# Patient Record
Sex: Female | Born: 1960 | Race: White | Hispanic: No | Marital: Married | Smoking: Former smoker
Health system: Southern US, Community
[De-identification: ages and names within clinical notes are randomized; demographics above are authoritative.]

## PROBLEM LIST (undated history)

## (undated) DIAGNOSIS — C801 Malignant (primary) neoplasm, unspecified: Secondary | ICD-10-CM

## (undated) DIAGNOSIS — E079 Disorder of thyroid, unspecified: Secondary | ICD-10-CM

## (undated) HISTORY — DX: Malignant (primary) neoplasm, unspecified: C80.1

## (undated) HISTORY — DX: Disorder of thyroid, unspecified: E07.9

---

## 1989-07-11 HISTORY — PX: TUBAL LIGATION: SHX77

## 1997-07-11 HISTORY — PX: MELANOMA EXCISION: SHX5266

## 2002-06-19 ENCOUNTER — Other Ambulatory Visit: Admission: RE | Admit: 2002-06-19 | Discharge: 2002-06-19 | Payer: Self-pay | Admitting: Obstetrics and Gynecology

## 2008-04-11 ENCOUNTER — Other Ambulatory Visit: Admission: RE | Admit: 2008-04-11 | Discharge: 2008-04-11 | Payer: Self-pay | Admitting: Obstetrics and Gynecology

## 2008-04-14 ENCOUNTER — Encounter: Admission: RE | Admit: 2008-04-14 | Discharge: 2008-04-14 | Payer: Self-pay | Admitting: Obstetrics and Gynecology

## 2008-07-24 ENCOUNTER — Encounter: Admission: RE | Admit: 2008-07-24 | Discharge: 2008-07-24 | Payer: Self-pay | Admitting: Internal Medicine

## 2008-08-06 ENCOUNTER — Encounter (INDEPENDENT_AMBULATORY_CARE_PROVIDER_SITE_OTHER): Payer: Self-pay | Admitting: Interventional Radiology

## 2008-08-06 ENCOUNTER — Encounter: Admission: RE | Admit: 2008-08-06 | Discharge: 2008-08-06 | Payer: Self-pay | Admitting: Internal Medicine

## 2008-08-06 ENCOUNTER — Other Ambulatory Visit: Admission: RE | Admit: 2008-08-06 | Discharge: 2008-08-06 | Payer: Self-pay | Admitting: Interventional Radiology

## 2009-04-13 ENCOUNTER — Other Ambulatory Visit: Admission: RE | Admit: 2009-04-13 | Discharge: 2009-04-13 | Payer: Self-pay | Admitting: Obstetrics and Gynecology

## 2009-04-16 ENCOUNTER — Encounter: Admission: RE | Admit: 2009-04-16 | Discharge: 2009-04-16 | Payer: Self-pay | Admitting: Obstetrics and Gynecology

## 2009-08-18 ENCOUNTER — Encounter: Admission: RE | Admit: 2009-08-18 | Discharge: 2009-08-18 | Payer: Self-pay | Admitting: Internal Medicine

## 2010-04-19 ENCOUNTER — Encounter: Admission: RE | Admit: 2010-04-19 | Discharge: 2010-04-19 | Payer: Self-pay | Admitting: Obstetrics and Gynecology

## 2010-05-06 ENCOUNTER — Other Ambulatory Visit: Admission: RE | Admit: 2010-05-06 | Discharge: 2010-05-06 | Payer: Self-pay | Admitting: Obstetrics and Gynecology

## 2011-03-15 ENCOUNTER — Other Ambulatory Visit: Payer: Self-pay | Admitting: Obstetrics and Gynecology

## 2011-03-15 DIAGNOSIS — Z1231 Encounter for screening mammogram for malignant neoplasm of breast: Secondary | ICD-10-CM

## 2011-04-25 ENCOUNTER — Ambulatory Visit
Admission: RE | Admit: 2011-04-25 | Discharge: 2011-04-25 | Disposition: A | Discharge status: Home / Self Care | Payer: 59 | Source: Ambulatory Visit | Attending: Obstetrics and Gynecology | Admitting: Obstetrics and Gynecology

## 2011-04-25 DIAGNOSIS — Z1231 Encounter for screening mammogram for malignant neoplasm of breast: Secondary | ICD-10-CM

## 2011-05-09 ENCOUNTER — Other Ambulatory Visit: Payer: Self-pay | Admitting: Obstetrics and Gynecology

## 2011-05-09 ENCOUNTER — Other Ambulatory Visit (HOSPITAL_COMMUNITY)
Admission: RE | Admit: 2011-05-09 | Discharge: 2011-05-09 | Disposition: A | Discharge status: Home / Self Care | Payer: 59 | Source: Ambulatory Visit | Attending: Obstetrics and Gynecology | Admitting: Obstetrics and Gynecology

## 2011-05-09 DIAGNOSIS — Z01419 Encounter for gynecological examination (general) (routine) without abnormal findings: Secondary | ICD-10-CM | POA: Insufficient documentation

## 2011-05-10 ENCOUNTER — Other Ambulatory Visit: Payer: Self-pay | Admitting: Internal Medicine

## 2011-05-10 DIAGNOSIS — E042 Nontoxic multinodular goiter: Secondary | ICD-10-CM

## 2011-05-18 ENCOUNTER — Other Ambulatory Visit: Payer: 59

## 2012-01-13 ENCOUNTER — Ambulatory Visit
Admission: RE | Admit: 2012-01-13 | Discharge: 2012-01-13 | Disposition: A | Discharge status: Home / Self Care | Payer: 59 | Source: Ambulatory Visit | Attending: Internal Medicine | Admitting: Internal Medicine

## 2012-01-13 DIAGNOSIS — E042 Nontoxic multinodular goiter: Secondary | ICD-10-CM

## 2012-04-03 ENCOUNTER — Other Ambulatory Visit: Payer: Self-pay | Admitting: Obstetrics and Gynecology

## 2012-04-03 DIAGNOSIS — Z1231 Encounter for screening mammogram for malignant neoplasm of breast: Secondary | ICD-10-CM

## 2012-04-25 ENCOUNTER — Ambulatory Visit
Admission: RE | Admit: 2012-04-25 | Discharge: 2012-04-25 | Disposition: A | Discharge status: Home / Self Care | Payer: 59 | Source: Ambulatory Visit | Attending: Obstetrics and Gynecology | Admitting: Obstetrics and Gynecology

## 2012-04-25 DIAGNOSIS — Z1231 Encounter for screening mammogram for malignant neoplasm of breast: Secondary | ICD-10-CM

## 2012-05-03 ENCOUNTER — Other Ambulatory Visit (HOSPITAL_COMMUNITY)
Admission: RE | Admit: 2012-05-03 | Discharge: 2012-05-03 | Disposition: A | Discharge status: Home / Self Care | Payer: 59 | Source: Ambulatory Visit | Attending: Obstetrics and Gynecology | Admitting: Obstetrics and Gynecology

## 2012-05-08 ENCOUNTER — Other Ambulatory Visit: Payer: Self-pay | Admitting: Obstetrics and Gynecology

## 2012-05-08 DIAGNOSIS — Z01419 Encounter for gynecological examination (general) (routine) without abnormal findings: Secondary | ICD-10-CM | POA: Insufficient documentation

## 2012-08-13 ENCOUNTER — Ambulatory Visit (INDEPENDENT_AMBULATORY_CARE_PROVIDER_SITE_OTHER): Payer: 59 | Admitting: Surgery

## 2012-08-13 ENCOUNTER — Encounter (INDEPENDENT_AMBULATORY_CARE_PROVIDER_SITE_OTHER): Payer: Self-pay | Admitting: Surgery

## 2012-08-13 VITALS — BP 150/88 | HR 80 | Temp 98.9°F | Resp 16 | Ht 64.5 in | Wt 158.4 lb

## 2012-08-13 DIAGNOSIS — K644 Residual hemorrhoidal skin tags: Secondary | ICD-10-CM

## 2012-08-13 DIAGNOSIS — Z800 Family history of malignant neoplasm of digestive organs: Secondary | ICD-10-CM

## 2012-08-13 DIAGNOSIS — E079 Disorder of thyroid, unspecified: Secondary | ICD-10-CM

## 2012-08-13 DIAGNOSIS — K620 Anal polyp: Secondary | ICD-10-CM

## 2012-08-13 HISTORY — DX: Disorder of thyroid, unspecified: E07.9

## 2012-08-13 NOTE — Progress Notes (Signed)
Subjective:     Patient ID: Alexandria Lambert" [name], female   DOB: [birth year], 52 y.o.   MRN: [id number]  HPI  Alexandria Lambert  1961-03-12 [id number]  Patient Care Team: Alexandria Lambert as PCP - General (Family Medicine) Alexandria Modena, MD as Consulting Physician (Gastroenterology)  This patient is a 52 y.o.female who presents today for surgical evaluation at the request of Dr. Hinda Lambert GI.   Reason for evaluation: Anal mass.  Question condyloma.  Pleasant active female.  Had severe hemorrhoid problems when she was pregnant years ago.  Not so much now.  Sister diagnosed with breast cancer age 28.  She underwent screening colonoscopy.  No abnormal masses were found until at the anal canal.Polyp seen.  Concern for condyloma.  Sent to me for evaluation from Dr. Dulce Lambert.  Patient denies any history of anal/genital warts.  No STDs.  No anal sex.  Has not had unprotected vaginal sex in a long time.  No fevers or chills.  No personal nor family history of GI/colon cancer, inflammatory bowel disease, irritable bowel syndrome, allergy such as Celiac Sprue, dietary/dairy problems, colitis, ulcers nor gastritis.  No recent sick contacts/gastroenteritis.  No travel outside the country.  No changes in diet.  She exercises regularly.    Patient Active Problem List  Diagnosis  . Thyroid disorder  . Anal polyp, right anterior pedunculated  . External hemorrhoids with itching  . Family history of rectal cancer    Past Medical History  Diagnosis Date  . Cancer     Skin  . Thyroid disease     Past Surgical History  Procedure Date  . Tubal ligation 1991  . Melanoma excision 1999    History   Social History  . Marital Status: Married    Spouse Name: N/A    Number of Children: N/A  . Years of Education: N/A   Occupational History  . Not on file.   Social History Main Topics  . Smoking status: Former Smoker    Types: Cigarettes    Quit date: 07/12/2007  . Smokeless tobacco: Never Used   . Alcohol Use: Yes     Comment: 2/day  . Drug Use: No  . Sexually Active:    Other Topics Concern  . Not on file   Social History Narrative  . No narrative on file    Family History  Problem Relation Age of Onset  . Cancer Mother     Cervical  . Cancer Father     Lung  . Cancer Sister     Rectal    Current Outpatient Prescriptions  Medication Sig Dispense Refill  . levothyroxine (SYNTHROID, LEVOTHROID) 100 MCG tablet Take 100 mcg by mouth daily.      . Multiple Vitamins-Minerals (ALIVE ONCE DAILY WOMENS 50+ PO) Take by mouth daily.      . Olopatadine HCl (PATANASE) 0.6 % SOLN Place into the nose as needed.      . vitamin C (ASCORBIC ACID) 500 MG tablet Take 500 mg by mouth as needed.         No Known Allergies  BP 150/88  Pulse 80  Temp 98.9 F (37.2 C) (Temporal)  Resp 16  Ht 5' 4.5" (1.638 m)  Wt 158 lb 6.4 oz (71.85 kg)  BMI 26.77 kg/m2  No results found.   Review of Systems  Constitutional: Negative for fever, chills, diaphoresis, appetite change and fatigue.  HENT: Negative for ear pain, sore throat, trouble swallowing, neck pain and ear  discharge.   Eyes: Negative for photophobia, discharge and visual disturbance.  Respiratory: Negative for cough, choking, chest tightness and shortness of breath.   Cardiovascular: Negative for chest pain and palpitations.  Gastrointestinal: Negative for nausea, vomiting, abdominal pain, diarrhea, constipation, anal bleeding and rectal pain.  Genitourinary: Negative for dysuria, frequency and difficulty urinating.  Musculoskeletal: Negative for myalgias and gait problem.  Skin: Negative for color change, pallor and rash.  Neurological: Negative for dizziness, speech difficulty, weakness and numbness.  Hematological: Negative for adenopathy.  Psychiatric/Behavioral: Negative for confusion and agitation. The patient is not nervous/anxious.        Objective:   Physical Exam  Constitutional: She is oriented to person,  place, and time. She appears well-developed and well-nourished. No distress.  HENT:  Head: Normocephalic.  Mouth/Throat: Oropharynx is clear and moist. No oropharyngeal exudate.  Eyes: Conjunctivae normal and EOM are normal. Pupils are equal, round, and reactive to light. No scleral icterus.  Neck: Normal range of motion. Neck supple. No tracheal deviation present.  Cardiovascular: Normal rate, regular rhythm and intact distal pulses.   Pulmonary/Chest: Effort normal and breath sounds normal. No respiratory distress. She exhibits no tenderness.  Abdominal: Soft. She exhibits no distension and no mass. There is no tenderness. Hernia confirmed negative in the right inguinal area and confirmed negative in the left inguinal area.  Genitourinary: No vaginal discharge found.       Exam done with assistance of female Medical Assistant in the room.  Perianal skin clean with good hygiene.  No pruritis.  No pilonidal disease.  No fissure.  No abscess/fistula.  Tolerates digital and anoscopic rectal exam.  Normal sphincter tone.   2 x 2 centimeter pedunculated right anterior anal canal mass. Mobile but firm/fibrotic.  No other rectal masses.   Several Pfefferkorn but sensitive external skin tags / hemorrhoids.    Musculoskeletal: Normal range of motion. She exhibits no tenderness.  Lymphadenopathy:    She has no cervical adenopathy.       Right: No inguinal adenopathy present.       Left: No inguinal adenopathy present.  Neurological: She is alert and oriented to person, place, and time. No cranial nerve deficit. She exhibits normal muscle tone. Coordination normal.  Skin: Skin is warm and dry. No rash noted. She is not diaphoretic. No erythema.  Psychiatric: She has a normal mood and affect. Her behavior is normal. Judgment and thought content normal.       Assessment:     Pedunculated anal canal mass.  Probable atypical hemorrhoid vs. Polyp.  Sensitive external hemorrhoids.    Plan:     Not  exactly certain what this anal canal mass is, but I suspect it is an atypical chronic hemorrhoid.  Condyloma vs. Adenomatous polyp is another possibility.  Given its mobility, I doubt cancer.  However family history of rectal cancer is a concern.  I strongly recommend examination under anesthesia and removal.  Offer to remove some of the external hemorrhoid/skin tags across discomfort.  She was very interested in this:  The anatomy & physiology of the digestive tract was discussed.  The pathophysiology of the rectal pathology was discussed.  Natural history risks without surgery was discussed.   I feel the risks of no intervention will lead to serious problems that outweigh the operative risks; therefore, I recommended surgery.    Laparoscopic & open abdominal techniques were discussed.  I recommended we start with a partial proctectomy by transanal examination for excisional biopsy to  remove the pathology and hopefully cure and/or control the pathology.  This technique can offer less operative risk and faster post-operative recovery.  Possible need for immediate or later abdominal surgery for further treatment was discussed. Technique of removal of external hemorrhoids discussed.  Probably would leave those wounds open to avoid scarring/stricturing.  Risks such as bleeding, abscess, reoperation, ostomy, heart attack, death, and other risks were discussed.   I noted a good likelihood this will help address the problem.  Goals of post-operative recovery were discussed as well.  We will work to minimize complications.  An educational handout was given as well.  Questions were answered.  The patient expresses understanding & wishes to proceed with surgery.

## 2012-08-13 NOTE — Patient Instructions (Addendum)
See the Handout(s) we gave you.  Consider surgery.  Please call our office at [phone] if you wish to schedule surgery or if you have further questions / concerns.   HEMORRHOIDS  The rectum is the last foot of your colon, and it naturally stretches to hold stool.  Hemorrhoidal piles are natural clusters of blood vessels that help the rectum and anal canal stretch to hold stool and allow bowel movements to eliminate feces.   Hemorrhoids are abnormally swollen blood vessels in the rectum.  Too much pressure in the rectum causes hemorrhoids by forcing blood to stretch and bulge the walls of the veins, sometimes even rupturing them.  Hemorrhoids can become like varicose veins you might see on a person's legs.  Most people will develop a flare of hemorrhoids in their lifetime.  When bulging hemorrhoidal veins are irritated, they can swell, burn, itch, cause pain, and bleed.  Most flares will calm down gradually own within a few weeks.  However, once hemorrhoids are created, they are difficult to get rid of completely and tend to flare more easily than the first flare.   Fortunately, good habits and simple medical treatment usually control hemorrhoids well, and surgery is needed only in severe cases. Types of Hemorrhoids:  Internal hemorrhoids usually don't initially hurt or itch; they are deep inside the rectum and usually have no sensation. If they begin to push out (prolapse), pain and burning can occur.  However, internal hemorrhoids can bleed.  Anal bleeding should not be ignored since bleeding could come from a dangerous source like colorectal cancer, so persistent rectal bleeding should be investigated by a doctor, sometimes with a colonoscopy.  External hemorrhoids cause most of the symptoms - pain, burning, and itching. Nonirritated hemorrhoids can look like Mier skin tags coming out of the anus.   Thrombosed hemorrhoids can form when a hemorrhoid blood vessel bursts and causes the hemorrhoid to  suddenly swell.  A purple blood clot can form in it and become an excruciatingly painful lump at the anus. Because of these unpleasant symptoms, immediate incision and drainage by a surgeon at an office visit can provide much relief of the pain.    PREVENTION Avoiding the most frequent causes listed below will prevent most cases of hemorrhoids: Constipation Hard stools Diarrhea  Constant sitting  Straining with bowel movements Sitting on the toilet for a long time  Severe coughing  episodes Pregnancy / Childbirth  Heavy Lifting  Sometimes avoiding the above triggers is difficult:  How can you avoid sitting all day if you have a seated job? Also, we try to avoid coughing and diarrhea, but sometimes it's beyond your control.  Still, there are some practical hints to help: Keep the anal and genital area clean.  Moistened tissues such as flushable wet wipes are less irritating than toilet paper.  Using irrigating showers or bottle irrigation washing gently cleans this sensitive area.   Avoid dry toilet paper when cleaning after bowel movements.  Marland Kitchen Keep the anal and genital area dry.  Lightly pat the rectal area dry.  Avoid rubbing.  Talcum or baby powders can help GET YOUR STOOLS SOFT.   This is the most important way to prevent irritated hemorrhoids.  Hard stools are like sandpaper to the anorectal canal and will cause more problems.  The goal: ONE SOFT BOWEL MOVEMENT A DAY!  BMs from every other day to 3 times a day is a tolerable range Treat coughing, diarrhea and constipation early since irritated  hemorrhoids may soon follow.  If your main job activity is seated, always stand or walk during your breaks. Make it a point to stand and walk at least 5 minutes every hour and try to shift frequently in your chair to avoid direct rectal pressure.  Always exhale as you strain or lift. Don't hold your breath.  Do not delay or try to prevent a bowel movement when the urge is present. Exercise regularly  (walking or jogging 60 minutes a day) to stimulate the bowels to move. No reading or other activity while on the toilet. If bowel movements take longer than 5 minutes, you are too constipated. AVOID CONSTIPATION Drink plenty of liquids (1 1/2 to 2 quarts of water and other fluids a day unless fluid restricted for another medical condition). Liquids that contain caffeine (coffee a, tea, soft drinks) can be dehydrating and should be avoided until constipation is controlled. Consider minimizing milk, as dairy products may be constipating. Eat plenty of fiber (30g a day ideal, more if needed).  Fiber is the undigested part of plant food that passes into the colon, acting as "natures broom" to encourage bowel motility and movement.  Fiber can absorb and hold large amounts of water. This results in a larger, bulkier stool, which is soft and easier to pass.  Eating foods high in fiber - 12 servings - such as  Vegetables: Root (potatoes, carrots, turnips), Leafy green (lettuce, salad greens, celery, spinach), High residue (cabbage, broccoli, etc.) Fruit: Fresh, Dried (prunes, apricots, cherries), Stewed (applesauce)  Whole grain breads, pasta, whole wheat Bran cereals, muffins, etc. Consider adding supplemental bulking fiber which retains large volumes of water: Psyllium ground seeds --available as Metamucil, Konsyl, Effersyllium, Per Diem Fiber, or the less expensive generic forms.  Citrucel  (methylcellulose wood fiber) . FiberCon (Polycarbophil) Polyethylene Glycol - and "artificial" fiber commonly called Miralax or Glycolax.  It is helpful for people with gassy or bloated feelings with regular fiber Flax Seed - a less gassy natural fiber  Laxatives can be useful for a short period if constipation is severe Osmotics (Milk of Magnesia, Fleets Phospho-Soda, Magnesium Citrate)  Stimulants (Senokot,   Castor Oil,  Dulcolax, Ex-Lax)    Laxatives are not a good long-term solution as it can stress the bowels  and cause too much mineral loss and dehydration.   Avoid taking laxatives for more than 7 days in a row.  AVOID DIARRHEA Switch to liquids and simpler foods for a few days to avoid stressing your intestines further. Avoid dairy products (especially milk & ice cream) for a short time.  The intestines often can lose the ability to digest lactose when stressed. Avoid foods that cause gassiness or bloating.  Typical foods include beans and other legumes, cabbage, broccoli, and dairy foods.  Every person has some sensitivity to other foods, so listen to your body and avoid those foods that trigger problems for you. Adding fiber (Citrucel, Metamucil, FiberCon, Flax seed, Miralax) gradually can help thicken stools by absorbing excess fluid and retrain the intestines to act more normally.  Slowly increase the dose over a few weeks.  Too much fiber too soon can backfire and cause cramping & bloating. Probiotics (such as active yogurt, Align, etc) may help repopulate the intestines and colon with normal bacteria and calm down a sensitive digestive tract.  Most studies show it to be of mild help, though, and such products can be costly. Medicines: Bismuth subsalicylate (ex. Kayopectate, Pepto Bismol) every 30 minutes for up to  6 doses can help control diarrhea.  Avoid if pregnant. Loperamide (Immodium) can slow down diarrhea.  Start with two tablets (4mg  total) first and then try one tablet every 6 hours.  Avoid if you are having fevers or severe pain.  If you are not better or start feeling worse, stop all medicines and call your doctor for advice Call your doctor if you are getting worse or not better.  Sometimes further testing (cultures, endoscopy, X-ray studies, bloodwork, etc) may be needed to help diagnose and treat the cause of the diarrhea. TREATMENT OF HEMORRHOID FLARE If these preventive measures fail, you must take action right away! Hemorrhoids are one condition that can be mild in the morning and  become intolerable by nightfall. Most hemorrhoidal flares take several weeks to calm down.  These suggestions can help: Warm soaks.  This helps more than any topical medication.  Use up to 8 times a day.  Usually sitz baths or sitting in a warm bathtub helps.  Sitting on moist warm towels are helpful.  Switching to ice packs/cool compresses can be helpful Normalize your bowels.  Extremes of diarrhea or constipation will make hemorrhoids worse.  One soft bowel movement a day is the goal.  Fiber can help get your bowels regular Wet wipes instead of toilet paper Pain control with a NSAID such as ibuprofen (Advil) or naproxen (Aleve) or acetaminophen (Tylenol) around the clock.  Narcotics are constipating and should be minimized if possible Topical creams contain steroids (bydrocortisone) or local anesthetic (xylocaine) can help make pain and itching more tolerable.   EVALUATION If hemorrhoids are still causing problems, you could benefit by an evaluation by a surgeon.  The surgeon will obtain a history and examine you.  If hemorrhoids are diagnosed, some therapies can be offered in the office, usually with an anoscope into the less sensitive area of the rectum: -injection of hemorrhoids (sclerotherapy) can scar the blood vessels of the swollen/enlarged hemorrhoids to help shrink them down to a more normal size -rubber banding of the enlarged hemorrhoids to help shrink them down to a more normal size -drainage of the blood clot causing a thrombosed hemorrhoid,  to relieve the severe pain   While 90% of the time such problems from hemorrhoids can be managed without preceding to surgery, sometimes the hemorrhoids require a operation to control the problem (uncontrolled bleeding, prolapse, pain, etc.).   This involves being placed under general anesthesia where the surgeon can confirm the diagnosis and remove, suture, or staple the hemorrhoid(s).  Your surgeon can help you treat the problem appropriately.       Colon Polyps Polyps are lumps of extra tissue growing inside the body. Polyps can grow in the large intestine (colon). Most colon polyps are noncancerous (benign). However, some colon polyps can become cancerous over time. Polyps that are larger than a pea may be harmful. To be safe, caregivers remove and test all polyps. CAUSES  Polyps form when mutations in the genes cause your cells to grow and divide even though no more tissue is needed. RISK FACTORS There are a number of risk factors that can increase your chances of getting colon polyps. They include:  Being older than 50 years.  Family history of colon polyps or colon cancer.  Long-term colon diseases, such as colitis or Crohn disease.  Being overweight.  Smoking.  Being inactive.  Drinking too much alcohol. SYMPTOMS  Most [place] polyps do not cause symptoms. If symptoms are present, they may include:  Blood in the stool. The stool may look dark red or black.  Constipation or diarrhea that lasts longer than 1 week. DIAGNOSIS People often do not know they have polyps until their caregiver finds them during a regular checkup. Your caregiver can use 4 tests to check for polyps:  Digital rectal exam. The caregiver wears gloves and feels inside the rectum. This test would find polyps only in the rectum.  Barium enema. The caregiver puts a liquid called barium into your rectum before taking X-rays of your colon. Barium makes your colon look white. Polyps are dark, so they are easy to see in the X-ray pictures.  Sigmoidoscopy. A thin, flexible tube (sigmoidoscope) is placed into your rectum. The sigmoidoscope has a light and tiny camera in it. The caregiver uses the sigmoidoscope to look at the last third of your colon.  Colonoscopy. This test is like sigmoidoscopy, but the caregiver looks at the entire colon. This is the most common method for finding and removing polyps. TREATMENT  Any polyps will be removed during a  sigmoidoscopy or colonoscopy. The polyps are then tested for cancer. PREVENTION  To help lower your risk of getting more colon polyps:  Eat plenty of fruits and vegetables. Avoid eating fatty foods.  Do not smoke.  Avoid drinking alcohol.  Exercise every day.  Lose weight if recommended by your caregiver.  Eat plenty of calcium and folate. Foods that are rich in calcium include milk, cheese, and broccoli. Foods that are rich in folate include chickpeas, kidney beans, and spinach. HOME CARE INSTRUCTIONS Keep all follow-up appointments as directed by your caregiver. You may need periodic exams to check for polyps. SEEK MEDICAL CARE IF: You notice bleeding during a bowel movement. Document Released: 03/23/2004 Document Revised: 09/19/2011 Document Reviewed: 09/06/2011 [facility] Patient Information 2013 [place], [place].

## 2012-08-16 ENCOUNTER — Encounter (INDEPENDENT_AMBULATORY_CARE_PROVIDER_SITE_OTHER): Payer: Self-pay

## 2012-08-17 ENCOUNTER — Telehealth (INDEPENDENT_AMBULATORY_CARE_PROVIDER_SITE_OTHER): Payer: Self-pay

## 2012-08-17 NOTE — Telephone Encounter (Signed)
The pt called because surgery is Tuesday. She has to start a prep Sunday.  She is supposed to be on clears and wants to know if she can use an Herbal protein powder that she mixes with water.  It is orange.  I told her this is fine.

## 2012-08-21 ENCOUNTER — Other Ambulatory Visit (INDEPENDENT_AMBULATORY_CARE_PROVIDER_SITE_OTHER): Payer: Self-pay | Admitting: Surgery

## 2012-08-21 DIAGNOSIS — K648 Other hemorrhoids: Secondary | ICD-10-CM

## 2012-08-21 DIAGNOSIS — K644 Residual hemorrhoidal skin tags: Secondary | ICD-10-CM

## 2012-08-21 HISTORY — PX: EXCISIONAL HEMORRHOIDECTOMY: SHX1541

## 2012-08-24 ENCOUNTER — Telehealth (INDEPENDENT_AMBULATORY_CARE_PROVIDER_SITE_OTHER): Payer: Self-pay

## 2012-08-24 NOTE — Telephone Encounter (Signed)
Called pt to notify her that her path is benign per Dr Michaell Cowing.

## 2012-09-10 ENCOUNTER — Ambulatory Visit (INDEPENDENT_AMBULATORY_CARE_PROVIDER_SITE_OTHER): Payer: 59 | Admitting: Surgery

## 2012-09-10 ENCOUNTER — Encounter (INDEPENDENT_AMBULATORY_CARE_PROVIDER_SITE_OTHER): Payer: Self-pay | Admitting: Surgery

## 2012-09-10 ENCOUNTER — Encounter (INDEPENDENT_AMBULATORY_CARE_PROVIDER_SITE_OTHER): Payer: Self-pay

## 2012-09-10 VITALS — BP 110/62 | HR 83 | Temp 97.0°F | Resp 18 | Ht 64.5 in | Wt 151.2 lb

## 2012-09-10 DIAGNOSIS — K620 Anal polyp: Secondary | ICD-10-CM

## 2012-09-10 DIAGNOSIS — K621 Rectal polyp: Secondary | ICD-10-CM

## 2012-09-10 DIAGNOSIS — K644 Residual hemorrhoidal skin tags: Secondary | ICD-10-CM

## 2012-09-10 NOTE — Progress Notes (Signed)
Subjective:     Patient ID: Alexandria Lambert, female   DOB: Dec 02, 1960, 52 y.o.   MRN: [id number]  HPI  Alexandria Lambert  1960/08/29 [id number]  Patient Care Team: Cain Saupe as PCP - General (Family Medicine) Willis Modena, MD as Consulting Physician (Gastroenterology) Talmage Coin, MD as Consulting Physician (Endocrinology)  This patient is a 52 y.o.female who presents today for surgical evaluation Status post excision of external hemorrhoids and ligation of hemorrhoids 08/24/2012  The patient comes in today feeling better.  Sore the first week but started to improve.  Went from walking half-hour day.  Doing low impact aerobics.  Looking to get back to work.  Wanting to do more intense exercise.  Double up on fiber.  Benefiber two twice a day.  Having 3-4 loose bowel movements a day.  No bleeding.  On narcotics.  No fevers or chills.  Heels a string coming out of the anus.  Overall she feels like she is making progress.  Patient Active Problem List  Diagnosis  . Thyroid disorder  . Anal polyp, right anterior pedunculated  . External hemorrhoids with itching  . Family history of rectal cancer    Past Medical History  Diagnosis Date  . Cancer     Skin  . Thyroid disorder 08/13/2012    On chronic levothyroxine.  Followed by Dr. Talmage Coin in past     Past Surgical History  Procedure Laterality Date  . Tubal ligation  1991  . Melanoma excision  1999    History   Social History  . Marital Status: Married    Spouse Name: N/A    Number of Children: N/A  . Years of Education: N/A   Occupational History  . Not on file.   Social History Main Topics  . Smoking status: Former Smoker    Types: Cigarettes    Quit date: 07/12/2007  . Smokeless tobacco: Never Used  . Alcohol Use: Yes     Comment: 2/day  . Drug Use: No  . Sexually Active:    Other Topics Concern  . Not on file   Social History Narrative  . No narrative on file    Family History  Problem Relation Age  of Onset  . Cancer Mother     Cervical  . Cancer Father     Lung  . Cancer Sister     Rectal    Current Outpatient Prescriptions  Medication Sig Dispense Refill  . Biotin 1000 MCG tablet Take 1,000 mcg by mouth 3 (three) times daily.      Marland Kitchen levothyroxine (SYNTHROID, LEVOTHROID) 88 MCG tablet Take 88 mcg by mouth daily.      . Multiple Vitamins-Minerals (ALIVE ONCE DAILY WOMENS 50+ PO) Take by mouth daily.      . Nutritional Supplements (CHOICE DM FIBER-BURST PO) Take by mouth.      . Olopatadine HCl (PATANASE) 0.6 % SOLN Place into the nose as needed.      . vitamin C (ASCORBIC ACID) 500 MG tablet Take 500 mg by mouth as needed.       No current facility-administered medications for this visit.     No Known Allergies  BP 110/62  Pulse 83  Temp(Src) 97 F (36.1 C) (Temporal)  Resp 18  Ht 5' 4.5" (1.638 m)  Wt 151 lb 3.2 oz (68.584 kg)  BMI 25.56 kg/m2  No results found.   Review of Systems  Constitutional: Negative for fever, chills and diaphoresis.  HENT: Negative  for ear pain, sore throat and trouble swallowing.   Eyes: Negative for photophobia and visual disturbance.  Respiratory: Negative for cough and choking.   Cardiovascular: Negative for chest pain and palpitations.  Gastrointestinal: Positive for rectal pain. Negative for nausea, vomiting, abdominal pain, diarrhea, constipation, blood in stool and abdominal distention.  Genitourinary: Negative for dysuria, frequency and difficulty urinating.  Musculoskeletal: Negative for myalgias and gait problem.  Skin: Negative for color change, pallor and rash.  Neurological: Negative for dizziness, speech difficulty, weakness and numbness.  Hematological: Negative for adenopathy.  Psychiatric/Behavioral: Negative for confusion and agitation. The patient is not nervous/anxious.        Objective:   Physical Exam  Constitutional: She is oriented to person, place, and time. She appears well-developed and well-nourished.  No distress.  HENT:  Head: Normocephalic.  Mouth/Throat: Oropharynx is clear and moist. No oropharyngeal exudate.  Eyes: Conjunctivae and EOM are normal. Pupils are equal, round, and reactive to light. No scleral icterus.  Neck: Normal range of motion. No tracheal deviation present.  Cardiovascular: Normal rate and intact distal pulses.   Pulmonary/Chest: Effort normal. No respiratory distress. She exhibits no tenderness.  Abdominal: Soft. She exhibits no distension. There is no tenderness. Hernia confirmed negative in the right inguinal area and confirmed negative in the left inguinal area.  Incisions clean with normal healing ridges.  No hernias  Genitourinary: No vaginal discharge found.  Exam done with assistance of female Medical Assistant in the room.  Perianal skin clean with good hygiene.  No pruritis.  Few Ocain swollen right postlat skin tag hems.  No pilonidal disease.  No fissure.  No abscess/fistula.     Normal sphincter tone.  Exposed Vicryl stitch.  I trimmed off.  Granulating left posterior lateral wound.  No digital or anoscopic exam today given recent postoperative state  Musculoskeletal: Normal range of motion. She exhibits no tenderness.  Lymphadenopathy:       Right: No inguinal adenopathy present.       Left: No inguinal adenopathy present.  Neurological: She is alert and oriented to person, place, and time. No cranial nerve deficit. She exhibits normal muscle tone. Coordination normal.  Skin: Skin is warm and dry. No rash noted. She is not diaphoretic.  Psychiatric: She has a normal mood and affect. Her behavior is normal.       Assessment:     Status post excision of external hemorrhoids with pexy.  Recovering well.  Loose bowels on very high fiber.     Plan:     Increase activity as tolerated to regular activity.  Do not push through pain.    Diet as tolerated. Bowel regimen to avoid problems.  Back off on fiber to usual Benefiber twice a day.  Avoid double  doses for now.  Okay to return to work.  Start half-time for a few days and then transition back to full-time.  Desk work only.  The anatomy & physiology of the anorectal region was discussed.  The pathophysiology of hemorrhoids and differential diagnosis was discussed.  Natural history progression  was discussed.   I stressed the importance of a bowel regimen to have daily soft bowel movements to minimize progression of disease.   Goal of one BM / day ideal.  Use of wet wipes, warm baths, avoiding straining, etc were emphasized.  Educational handouts further explaining the pathology, treatment options, and bowel regimen were given as well.   The patient expressed understanding.  Return to clinic 3-4 weeks.  Instructions discussed.  Followup with primary care physician for other health issues as would normally be done.  Questions answered.  The patient expressed understanding and appreciation

## 2012-09-10 NOTE — Patient Instructions (Addendum)
ANORECTAL SURGERY: POST OP INSTRUCTIONS  1. Take your usually prescribed home medications unless otherwise directed. 2. DIET: Follow a light bland diet the first 24 hours after arrival home, such as soup, liquids, crackers, etc.  Be sure to include lots of fluids daily.  Avoid fast food or heavy meals as your are more likely to get nauseated.  Eat a low fat the next few days after surgery.   3. PAIN CONTROL: a. Pain is best controlled by a usual combination of three different methods TOGETHER: i. Ice/Heat ii. Over the counter pain medication iii. Prescription pain medication b. Most patients will experience some swelling and discomfort in the anus/rectal area. and incisions.  Ice packs or heat (30-60 minutes up to 6 times a day) will help. Use ice for the first few days to help decrease swelling and bruising, then switch to heat such as warm towels, sitz baths, warm baths, etc to help relax tight/sore spots and speed recovery.  Some people prefer to use ice alone, heat alone, alternating between ice & heat.  Experiment to what works for you.  Swelling and bruising can take several weeks to resolve.   c. It is helpful to take an over-the-counter pain medication regularly for the first few weeks.  Choose one of the following that works best for you: i. Naproxen (Aleve, etc)  Two 220mg tabs twice a day ii. Ibuprofen (Advil, etc) Three 200mg tabs four times a day (every meal & bedtime) iii. Acetaminophen (Tylenol, etc) 500-650mg four times a day (every meal & bedtime) d. A  prescription for pain medication (such as oxycodone, hydrocodone, etc) should be given to you upon discharge.  Take your pain medication as prescribed.  i. If you are having problems/concerns with the prescription medicine (does not control pain, nausea, vomiting, rash, itching, etc), please call us [phone] to see if we need to switch you to a different pain medicine that will work better for you and/or control your side effect  better. ii. If you need a refill on your pain medication, please contact your pharmacy.  They will contact our office to request authorization. Prescriptions will not be filled after 5 pm or on week-ends. 4. KEEP YOUR BOWELS REGULAR a. The goal is one bowel movement a day b. Avoid getting constipated.  Between the surgery and the pain medications, it is common to experience some constipation.  Increasing fluid intake and taking a fiber supplement (such as Metamucil, Citrucel, FiberCon, MiraLax, etc) 1-2 times a day regularly will usually help prevent this problem from occurring.  A mild laxative (prune juice, Milk of Magnesia, MiraLax, etc) should be taken according to package directions if there are no bowel movements after 48 hours. c. Watch out for diarrhea.  If you have many loose bowel movements, simplify your diet to bland foods & liquids for a few days.  Stop any stool softeners and decrease your fiber supplement.  Switching to mild anti-diarrheal medications (Kayopectate, Pepto Bismol) can help.  If this worsens or does not improve, please call us.  5. Wound Care a. Remove your bandages the day after surgery.  Unless discharge instructions indicate otherwise, leave your bandage dry and in place overnight.  Remove the bandage during your first bowel movement.   b. Allow the wound packing to fall out over the next few days.  You can trim exposed gauze / ribbon as it falls out.  You do not need to repack the wound unless instructed otherwise.  Wear an   absorbent pad or soft cotton gauze in your underwear as needed to catch any drainage and help keep the area  c. Keep the area clean and dry.  Bathe / shower every day.  Keep the area clean by showering / bathing over the incision / wound.   It is okay to soak an open wound to help wash it.  Wet wipes or showers / gentle washing after bowel movements is often less traumatic than regular toilet paper. d. Dennis Bast may have some styrofoam-like soft packing in  the rectum which will come out with the first bowel movement.  e. You will often notice bleeding with bowel movements.  This should slow down by the end of the first week of surgery f. Expect some drainage.  This should slow down, too, by the end of the first week of surgery.  Wear an absorbent pad or soft cotton gauze in your underwear until the drainage stops. 6. ACTIVITIES as tolerated:   a. You may resume regular (light) daily activities beginning the next day-such as daily self-care, walking, climbing stairs-gradually increasing activities as tolerated.  If you can walk 30 minutes without difficulty, it is safe to try more intense activity such as jogging, treadmill, bicycling, low-impact aerobics, swimming, etc. b. Save the most intensive and strenuous activity for last such as sit-ups, heavy lifting, contact sports, etc  Refrain from any heavy lifting or straining until you are off narcotics for pain control.   c. DO NOT PUSH THROUGH PAIN.  Let pain be your guide: If it hurts to do something, don't do it.  Pain is your body warning you to avoid that activity for another week until the pain goes down. d. You may drive when you are no longer taking prescription pain medication, you can comfortably sit for long periods of time, and you can safely maneuver your car and apply brakes. e. Dennis Bast may have sexual intercourse when it is comfortable.  7. FOLLOW UP in our office a. Please call [facility] at [phone] to set up an appointment to see your surgeon in the office for a follow-up appointment approximately 2 weeks after your surgery. b. Make sure that you call for this appointment the day you arrive home to insure a convenient appointment time. 10. IF YOU HAVE DISABILITY OR FAMILY LEAVE FORMS, BRING THEM TO THE OFFICE FOR PROCESSING.  DO NOT GIVE THEM TO YOUR DOCTOR.        WHEN TO CALL us [phone]: 1. Poor pain control 2. Reactions / problems with new medications (rash/itching, nausea,  etc)  3. Fever over 101.5 F (38.5 C) 4. Inability to urinate 5. Nausea and/or vomiting 6. Worsening swelling or bruising 7. Continued bleeding from incision. 8. Increased pain, redness, or drainage from the incision  The clinic staff is available to answer your questions during regular business hours (8:30am-5pm).  Please don't hesitate to call and ask to speak to one of our nurses for clinical concerns.   A surgeon from [facility] is always on call at the hospitals   If you have a medical emergency, go to the nearest emergency room or call 911.    [facility], [address], [place], [place], [place]  [address] ? MAIN: [phone] ? TOLL FREE: [phone] ? FAX [phone] [email or web address]  GETTING TO GOOD BOWEL HEALTH. Irregular bowel habits such as constipation and diarrhea can lead to many problems over time.  Having one soft bowel movement a day  is the most important way to prevent further problems.  The anorectal canal is designed to handle stretching and feces to safely manage our ability to get rid of solid waste (feces, poop, stool) out of our body.  BUT, hard constipated stools can act like ripping concrete bricks and diarrhea can be a burning fire to this very sensitive area of our body, causing inflamed hemorrhoids, anal fissures, increasing risk is perirectal abscesses, abdominal pain/bloating, an making irritable bowel worse.     The goal: ONE SOFT BOWEL MOVEMENT A DAY!  To have soft, regular bowel movements:    Drink at least 8 tall glasses of water a day.     Take plenty of fiber.  Fiber is the undigested part of plant food that passes into the colon, acting s "natures broom" to encourage bowel motility and movement.  Fiber can absorb and hold large amounts of water. This results in a larger, bulkier stool, which is soft and easier to pass. Work gradually over several weeks up to 6 servings a day of fiber (25g a day  even more if needed) in the form of: o Vegetables -- Root (potatoes, carrots, turnips), leafy green (lettuce, salad greens, celery, spinach), or cooked high residue (cabbage, broccoli, etc) o Fruit -- Fresh (unpeeled skin & pulp), Dried (prunes, apricots, cherries, etc ),  or stewed ( applesauce)  o Whole grain breads, pasta, etc (whole wheat)  o Bran cereals    Bulking Agents -- This type of water-retaining fiber generally is easily obtained each day by one of the following:  o Psyllium bran -- The psyllium plant is remarkable because its ground seeds can retain so much water. This product is available as Metamucil, Konsyl, Effersyllium, Per Diem Fiber, or the less expensive generic preparation in drug and health food stores. Although labeled a laxative, it really is not a laxative.  o Methylcellulose -- This is another fiber derived from wood which also retains water. It is available as Citrucel. o Polyethylene Glycol - and "artificial" fiber commonly called Miralax or Glycolax.  It is helpful for people with gassy or bloated feelings with regular fiber o Flax Seed - a less gassy fiber than psyllium   No reading or other relaxing activity while on the toilet. If bowel movements take longer than 5 minutes, you are too constipated   AVOID CONSTIPATION.  High fiber and water intake usually takes care of this.  Sometimes a laxative is needed to stimulate more frequent bowel movements, but    Laxatives are not a good long-term solution as it can wear the colon out. o Osmotics (Milk of Magnesia, Fleets phosphosoda, Magnesium citrate, MiraLax, GoLytely) are safer than  o Stimulants (Senokot, Castor Oil, Dulcolax, Ex Lax)    o Do not take laxatives for more than 7days in a row.    IF SEVERELY CONSTIPATED, try a Bowel Retraining Program: o Do not use laxatives.  o Eat a diet high in roughage, such as bran cereals and leafy vegetables.  o Drink six (6) ounces of prune or apricot juice each morning.   o Eat two (2) large servings of stewed fruit each day.  o Take one (1) heaping tablespoon of a psyllium-based bulking agent twice a day. Use sugar-free sweetener when possible to avoid excessive calories.  o Eat a normal breakfast.  o Set aside 15 minutes after breakfast to sit on the toilet, but do not strain to have a bowel movement.  o If you do not have  a bowel movement by the third day, use an enema and repeat the above steps.    Controlling diarrhea o Switch to liquids and simpler foods for a few days to avoid stressing your intestines further. o Avoid dairy products (especially milk & ice cream) for a short time.  The intestines often can lose the ability to digest lactose when stressed. o Avoid foods that cause gassiness or bloating.  Typical foods include beans and other legumes, cabbage, broccoli, and dairy foods.  Every person has some sensitivity to other foods, so listen to our body and avoid those foods that trigger problems for you. o Adding fiber (Citrucel, Metamucil, psyllium, Miralax) gradually can help thicken stools by absorbing excess fluid and retrain the intestines to act more normally.  Slowly increase the dose over a few weeks.  Too much fiber too soon can backfire and cause cramping & bloating. o Probiotics (such as active yogurt, Align, etc) may help repopulate the intestines and colon with normal bacteria and calm down a sensitive digestive tract.  Most studies show it to be of mild help, though, and such products can be costly. o Medicines:   Bismuth subsalicylate (ex. Kayopectate, Pepto Bismol) every 30 minutes for up to 6 doses can help control diarrhea.  Avoid if pregnant.   Loperamide (Immodium) can slow down diarrhea.  Start with two tablets (4mg  total) first and then try one tablet every 6 hours.  Avoid if you are having fevers or severe pain.  If you are not better or start feeling worse, stop all medicines and call your doctor for advice o Call your doctor if you  are getting worse or not better.  Sometimes further testing (cultures, endoscopy, X-ray studies, bloodwork, etc) may be needed to help diagnose and treat the cause of the diarrhea. o

## 2012-10-16 ENCOUNTER — Ambulatory Visit (INDEPENDENT_AMBULATORY_CARE_PROVIDER_SITE_OTHER): Payer: 59 | Admitting: Surgery

## 2012-10-16 ENCOUNTER — Encounter (INDEPENDENT_AMBULATORY_CARE_PROVIDER_SITE_OTHER): Payer: Self-pay | Admitting: Surgery

## 2012-10-16 VITALS — BP 126/70 | HR 76 | Temp 97.7°F | Resp 16 | Ht 64.5 in | Wt 154.0 lb

## 2012-10-16 DIAGNOSIS — K620 Anal polyp: Secondary | ICD-10-CM

## 2012-10-16 DIAGNOSIS — K644 Residual hemorrhoidal skin tags: Secondary | ICD-10-CM

## 2012-10-16 NOTE — Patient Instructions (Signed)
HEMORRHOIDS  The rectum is the last foot of your colon, and it naturally stretches to hold stool.  Hemorrhoidal piles are natural clusters of blood vessels that help the rectum and anal canal stretch to hold stool and allow bowel movements to eliminate feces.   Hemorrhoids are abnormally swollen blood vessels in the rectum.  Too much pressure in the rectum causes hemorrhoids by forcing blood to stretch and bulge the walls of the veins, sometimes even rupturing them.  Hemorrhoids can become like varicose veins you might see on a person's legs.  Most people will develop a flare of hemorrhoids in their lifetime.  When bulging hemorrhoidal veins are irritated, they can swell, burn, itch, cause pain, and bleed.  Most flares will calm down gradually own within a few weeks.  However, once hemorrhoids are created, they are difficult to get rid of completely and tend to flare more easily than the first flare.   Fortunately, good habits and simple medical treatment usually control hemorrhoids well, and surgery is needed only in severe cases. Types of Hemorrhoids:  Internal hemorrhoids usually don't initially hurt or itch; they are deep inside the rectum and usually have no sensation. If they begin to push out (prolapse), pain and burning can occur.  However, internal hemorrhoids can bleed.  Anal bleeding should not be ignored since bleeding could come from a dangerous source like colorectal cancer, so persistent rectal bleeding should be investigated by a doctor, sometimes with a colonoscopy.  External hemorrhoids cause most of the symptoms - pain, burning, and itching. Nonirritated hemorrhoids can look like [place] skin tags coming out of the anus.   Thrombosed hemorrhoids can form when a hemorrhoid blood vessel bursts and causes the hemorrhoid to suddenly swell.  A purple blood clot can form in it and become an excruciatingly painful lump at the anus. Because of these unpleasant symptoms, immediate incision and  drainage by a surgeon at an office visit can provide much relief of the pain.    PREVENTION Avoiding the most frequent causes listed below will prevent most cases of hemorrhoids: Constipation Hard stools Diarrhea  Constant sitting  Straining with bowel movements Sitting on the toilet for a long time  Severe coughing  episodes Pregnancy / Childbirth  Heavy Lifting  Sometimes avoiding the above triggers is difficult:  How can you avoid sitting all day if you have a seated job? Also, we try to avoid coughing and diarrhea, but sometimes it's beyond your control.  Still, there are some practical hints to help: Keep the anal and genital area clean.  Moistened tissues such as flushable wet wipes are less irritating than toilet paper.  Using irrigating showers or bottle irrigation washing gently cleans this sensitive area.   Avoid dry toilet paper when cleaning after bowel movements.  . Keep the anal and genital area dry.  Lightly pat the rectal area dry.  Avoid rubbing.  Talcum or baby powders can help GET YOUR STOOLS SOFT.   This is the most important way to prevent irritated hemorrhoids.  Hard stools are like sandpaper to the anorectal canal and will cause more problems.  The goal: ONE SOFT BOWEL MOVEMENT A DAY!  BMs from every other day to 3 times a day is a tolerable range Treat coughing, diarrhea and constipation early since irritated hemorrhoids may soon follow.  If your main job activity is seated, always stand or walk during your breaks. Make it a point to stand and walk at least 5 minutes every hour   and try to shift frequently in your chair to avoid direct rectal pressure.  Always exhale as you strain or lift. Don't hold your breath.  Do not delay or try to prevent a bowel movement when the urge is present. Exercise regularly (walking or jogging 60 minutes a day) to stimulate the bowels to move. No reading or other activity while on the toilet. If bowel movements take longer than 5 minutes,  you are too constipated. AVOID CONSTIPATION Drink plenty of liquids (1 1/2 to 2 quarts of water and other fluids a day unless fluid restricted for another medical condition). Liquids that contain caffeine (coffee a, tea, soft drinks) can be dehydrating and should be avoided until constipation is controlled. Consider minimizing milk, as dairy products may be constipating. Eat plenty of fiber (30g a day ideal, more if needed).  Fiber is the undigested part of plant food that passes into the colon, acting as "natures broom" to encourage bowel motility and movement.  Fiber can absorb and hold large amounts of water. This results in a larger, bulkier stool, which is soft and easier to pass.  Eating foods high in fiber - 12 servings - such as  Vegetables: Root (potatoes, carrots, turnips), Leafy green (lettuce, salad greens, celery, spinach), High residue (cabbage, broccoli, etc.) Fruit: Fresh, Dried (prunes, apricots, cherries), Stewed (applesauce)  Whole grain breads, pasta, whole wheat Bran cereals, muffins, etc. Consider adding supplemental bulking fiber which retains large volumes of water: Psyllium ground seeds --available as Metamucil, Konsyl, Effersyllium, Per Diem Fiber, or the less expensive generic forms.  Citrucel  (methylcellulose wood fiber) . FiberCon (Polycarbophil) Polyethylene Glycol - and "artificial" fiber commonly called Miralax or Glycolax.  It is helpful for people with gassy or bloated feelings with regular fiber Flax Seed - a less gassy natural fiber  Laxatives can be useful for a short period if constipation is severe Osmotics (Milk of Magnesia, Fleets Phospho-Soda, Magnesium Citrate)  Stimulants (Senokot,   Castor Oil,  Dulcolax, Ex-Lax)    Laxatives are not a good long-term solution as it can stress the bowels and cause too much mineral loss and dehydration.   Avoid taking laxatives for more than 7 days in a row.  AVOID DIARRHEA Switch to liquids and simpler foods for a few  days to avoid stressing your intestines further. Avoid dairy products (especially milk & ice cream) for a short time.  The intestines often can lose the ability to digest lactose when stressed. Avoid foods that cause gassiness or bloating.  Typical foods include beans and other legumes, cabbage, broccoli, and dairy foods.  Every person has some sensitivity to other foods, so listen to your body and avoid those foods that trigger problems for you. Adding fiber (Citrucel, Metamucil, FiberCon, Flax seed, Miralax) gradually can help thicken stools by absorbing excess fluid and retrain the intestines to act more normally.  Slowly increase the dose over a few weeks.  Too much fiber too soon can backfire and cause cramping & bloating. Probiotics (such as active yogurt, Align, etc) may help repopulate the intestines and colon with normal bacteria and calm down a sensitive digestive tract.  Most studies show it to be of mild help, though, and such products can be costly. Medicines: Bismuth subsalicylate (ex. Kayopectate, Pepto Bismol) every 30 minutes for up to 6 doses can help control diarrhea.  Avoid if pregnant. Loperamide (Immodium) can slow down diarrhea.  Start with two tablets (4mg total) first and then try one tablet every 6 hours.    Avoid if you are having fevers or severe pain.  If you are not better or start feeling worse, stop all medicines and call your doctor for advice Call your doctor if you are getting worse or not better.  Sometimes further testing (cultures, endoscopy, X-ray studies, bloodwork, etc) may be needed to help diagnose and treat the cause of the diarrhea. TREATMENT OF HEMORRHOID FLARE If these preventive measures fail, you must take action right away! Hemorrhoids are one condition that can be mild in the morning and become intolerable by nightfall. Most hemorrhoidal flares take several weeks to calm down.  These suggestions can help: Warm soaks.  This helps more than any topical  medication.  Use up to 8 times a day.  Usually sitz baths or sitting in a warm bathtub helps.  Sitting on moist warm towels are helpful.  Switching to ice packs/cool compresses can be helpful Normalize your bowels.  Extremes of diarrhea or constipation will make hemorrhoids worse.  One soft bowel movement a day is the goal.  Fiber can help get your bowels regular Wet wipes instead of toilet paper Pain control with a NSAID such as ibuprofen (Advil) or naproxen (Aleve) or acetaminophen (Tylenol) around the clock.  Narcotics are constipating and should be minimized if possible Topical creams contain steroids (bydrocortisone) or local anesthetic (xylocaine) can help make pain and itching more tolerable.   EVALUATION If hemorrhoids are still causing problems, you could benefit by an evaluation by a surgeon.  The surgeon will obtain a history and examine you.  If hemorrhoids are diagnosed, some therapies can be offered in the office, usually with an anoscope into the less sensitive area of the rectum: -injection of hemorrhoids (sclerotherapy) can scar the blood vessels of the swollen/enlarged hemorrhoids to help shrink them down to a more normal size -rubber banding of the enlarged hemorrhoids to help shrink them down to a more normal size -drainage of the blood clot causing a thrombosed hemorrhoid,  to relieve the severe pain   While 90% of the time such problems from hemorrhoids can be managed without preceding to surgery, sometimes the hemorrhoids require a operation to control the problem (uncontrolled bleeding, prolapse, pain, etc.).   This involves being placed under general anesthesia where the surgeon can confirm the diagnosis and remove, suture, or staple the hemorrhoid(s).  Your surgeon can help you treat the problem appropriately.    

## 2012-10-16 NOTE — Progress Notes (Signed)
Subjective:     Patient ID: Alexandria Lambert, female   DOB: Dec 15, 1960, 52 y.o.   MRN: [id number]  HPI   DAVID TOWSON  12/08/1960 [id number]  Patient Care Team: Cain Saupe as PCP - General (Family Medicine) Willis Modena, MD as Consulting Physician (Gastroenterology) Talmage Coin, MD as Consulting Physician (Endocrinology)  This patient is a 52 y.o.female who presents today for surgical evaluation Status post excision of external hemorrhoids and ligation of hemorrhoids 08/21/2012  FINAL DIAGNOSIS Diagnosis 1. Anus, biopsy, right posterior canal - HEMORRHOIDAL TISSUE. - THERE IS NO EVIDENCE OF MALIGNANCY. 2. Hemorrhoids, external - HEMORRHOIDAL TISSUE. - THERE IS NO EVIDENCE OF MALIGNANCY.  The patient comes in today feeling better.  No pain.  No blood.  Still using wet wipes.  Having one to two soft bowel movements a day.  Less diarrhea.  Overall feeling well.  Patient Active Problem List  Diagnosis  . Thyroid disorder  . Anal polyp, right anterior pedunculated s/p removal [id number]  . External hemorrhoids with itching s/p removal [id number]  . Family history of rectal cancer    Past Medical History  Diagnosis Date  . Cancer     Skin  . Thyroid disorder 08/13/2012    On chronic levothyroxine.  Followed by Dr. Talmage Coin in past     Past Surgical History  Procedure Laterality Date  . Tubal ligation  1991  . Melanoma excision  1999  . Excisional hemorrhoidectomy  08/24/2012    x2 piles    History   Social History  . Marital Status: Married    Spouse Name: N/A    Number of Children: N/A  . Years of Education: N/A   Occupational History  . Not on file.   Social History Main Topics  . Smoking status: Former Smoker    Types: Cigarettes    Quit date: 07/12/2007  . Smokeless tobacco: Never Used  . Alcohol Use: Yes     Comment: 2/day  . Drug Use: No  . Sexually Active:    Other Topics Concern  . Not on file   Social History Narrative  . No narrative on file     Family History  Problem Relation Age of Onset  . Cancer Mother     Cervical  . Cancer Father     Lung  . Cancer Sister     Rectal    Current Outpatient Prescriptions  Medication Sig Dispense Refill  . Biotin 1000 MCG tablet Take 1,000 mcg by mouth 3 (three) times daily.      Marland Kitchen levothyroxine (SYNTHROID, LEVOTHROID) 88 MCG tablet Take 88 mcg by mouth daily.      . Multiple Vitamins-Minerals (ALIVE ONCE DAILY WOMENS 50+ PO) Take by mouth daily.      . Nutritional Supplements (CHOICE DM FIBER-BURST PO) Take by mouth.      . Olopatadine HCl (PATANASE) 0.6 % SOLN Place into the nose as needed.      . vitamin C (ASCORBIC ACID) 500 MG tablet Take 500 mg by mouth as needed.       No current facility-administered medications for this visit.     No Known Allergies  BP 126/70  Pulse 76  Temp(Src) 97.7 F (36.5 C) (Temporal)  Resp 16  Ht 5' 4.5" (1.638 m)  Wt 154 lb (69.854 kg)  BMI 26.04 kg/m2  No results found.   Review of Systems  Constitutional: Negative for fever, chills and diaphoresis.  HENT: Negative for ear pain, sore throat  and trouble swallowing.   Eyes: Negative for photophobia and visual disturbance.  Respiratory: Negative for cough and choking.   Cardiovascular: Negative for chest pain and palpitations.  Gastrointestinal: Positive for rectal pain. Negative for nausea, vomiting, abdominal pain, diarrhea, constipation, blood in stool and abdominal distention.  Genitourinary: Negative for dysuria, frequency and difficulty urinating.  Musculoskeletal: Negative for myalgias and gait problem.  Skin: Negative for color change, pallor and rash.  Neurological: Negative for dizziness, speech difficulty, weakness and numbness.  Hematological: Negative for adenopathy.  Psychiatric/Behavioral: Negative for confusion and agitation. The patient is not nervous/anxious.        Objective:   Physical Exam  Constitutional: She is oriented to person, place, and time. She  appears well-developed and well-nourished. No distress.  HENT:  Head: Normocephalic.  Mouth/Throat: Oropharynx is clear and moist. No oropharyngeal exudate.  Eyes: Conjunctivae and EOM are normal. Pupils are equal, round, and reactive to light. No scleral icterus.  Neck: Normal range of motion. No tracheal deviation present.  Cardiovascular: Normal rate and intact distal pulses.   Pulmonary/Chest: Effort normal. No respiratory distress. She exhibits no tenderness.  Abdominal: Soft. She exhibits no distension. There is no tenderness. Hernia confirmed negative in the right inguinal area and confirmed negative in the left inguinal area.  Incisions clean with normal healing ridges.  No hernias  Genitourinary: No vaginal discharge found.  Exam done with assistance of female Medical Assistant in the room.  Perianal skin clean with good hygiene.  No pruritis.  No pilonidal disease.  No fissure.  No abscess/fistula.    Normal sphincter tone.  Tolerates DRE.  Healing scar from hemorrhoidectomy.  Mild skin tags.  No tumors or other concern.  Musculoskeletal: Normal range of motion. She exhibits no tenderness.  Lymphadenopathy:       Right: No inguinal adenopathy present.       Left: No inguinal adenopathy present.  Neurological: She is alert and oriented to person, place, and time. No cranial nerve deficit. She exhibits normal muscle tone. Coordination normal.  Skin: Skin is warm and dry. No rash noted. She is not diaphoretic.  Psychiatric: She has a normal mood and affect. Her behavior is normal.       Assessment:     Status post excision of external hemorrhoids with pexy.  Recovering well.      Plan:     Increase activity as tolerated to regular activity.  Do not push through pain.    Diet as tolerated. Bowel regimen to avoid problems.  Benefiber.  The anatomy & physiology of the anorectal region was discussed.  The pathophysiology of hemorrhoids and differential diagnosis was discussed.   Natural history progression  was discussed.   I stressed the importance of a bowel regimen to have daily soft bowel movements to minimize progression of disease.   Goal of one BM / day ideal.  Use of wet wipes, warm baths, avoiding straining, etc were emphasized.  Educational handouts further explaining the pathology, treatment options, and bowel regimen were given as well.   The patient expressed understanding.  Return to clinic PRN.   Instructions discussed.  Followup with primary care physician for other health issues as would normally be done.  Questions answered.  The patient expressed understanding and appreciation

## 2013-04-01 ENCOUNTER — Other Ambulatory Visit: Payer: Self-pay

## 2013-04-01 DIAGNOSIS — Z1231 Encounter for screening mammogram for malignant neoplasm of breast: Secondary | ICD-10-CM

## 2013-04-26 ENCOUNTER — Ambulatory Visit
Admission: RE | Admit: 2013-04-26 | Discharge: 2013-04-26 | Disposition: A | Discharge status: Home / Self Care | Payer: 59 | Source: Ambulatory Visit

## 2013-04-26 DIAGNOSIS — Z1231 Encounter for screening mammogram for malignant neoplasm of breast: Secondary | ICD-10-CM

## 2013-05-02 ENCOUNTER — Other Ambulatory Visit: Payer: Self-pay | Admitting: Obstetrics and Gynecology

## 2013-05-02 DIAGNOSIS — R928 Other abnormal and inconclusive findings on diagnostic imaging of breast: Secondary | ICD-10-CM

## 2013-05-07 ENCOUNTER — Ambulatory Visit: Payer: Self-pay

## 2013-05-09 ENCOUNTER — Other Ambulatory Visit (HOSPITAL_COMMUNITY)
Admission: RE | Admit: 2013-05-09 | Discharge: 2013-05-09 | Disposition: A | Discharge status: Home / Self Care | Payer: 59 | Source: Ambulatory Visit | Attending: Obstetrics and Gynecology | Admitting: Obstetrics and Gynecology

## 2013-05-09 ENCOUNTER — Other Ambulatory Visit: Payer: Self-pay | Admitting: Obstetrics and Gynecology

## 2013-05-09 DIAGNOSIS — Z1151 Encounter for screening for human papillomavirus (HPV): Secondary | ICD-10-CM | POA: Insufficient documentation

## 2013-05-09 DIAGNOSIS — Z01419 Encounter for gynecological examination (general) (routine) without abnormal findings: Secondary | ICD-10-CM | POA: Insufficient documentation

## 2013-05-20 ENCOUNTER — Ambulatory Visit
Admission: RE | Admit: 2013-05-20 | Discharge: 2013-05-20 | Disposition: A | Discharge status: Home / Self Care | Payer: 59 | Source: Ambulatory Visit | Attending: Obstetrics and Gynecology | Admitting: Obstetrics and Gynecology

## 2013-05-20 DIAGNOSIS — R928 Other abnormal and inconclusive findings on diagnostic imaging of breast: Secondary | ICD-10-CM

## 2013-12-17 ENCOUNTER — Other Ambulatory Visit: Payer: Self-pay | Admitting: Obstetrics and Gynecology

## 2013-12-17 DIAGNOSIS — N63 Unspecified lump in unspecified breast: Secondary | ICD-10-CM

## 2014-01-02 ENCOUNTER — Ambulatory Visit
Admission: RE | Admit: 2014-01-02 | Discharge: 2014-01-02 | Disposition: A | Discharge status: Home / Self Care | Payer: 59 | Source: Ambulatory Visit | Attending: Obstetrics and Gynecology | Admitting: Obstetrics and Gynecology

## 2014-01-02 ENCOUNTER — Other Ambulatory Visit: Payer: Self-pay | Admitting: Obstetrics and Gynecology

## 2014-01-02 DIAGNOSIS — N63 Unspecified lump in unspecified breast: Secondary | ICD-10-CM

## 2014-05-07 ENCOUNTER — Other Ambulatory Visit: Payer: Self-pay | Admitting: Obstetrics and Gynecology

## 2014-05-07 ENCOUNTER — Other Ambulatory Visit (HOSPITAL_COMMUNITY)
Admission: RE | Admit: 2014-05-07 | Discharge: 2014-05-07 | Disposition: A | Discharge status: Home / Self Care | Payer: 59 | Source: Ambulatory Visit | Attending: Obstetrics and Gynecology | Admitting: Obstetrics and Gynecology

## 2014-05-07 DIAGNOSIS — Z01419 Encounter for gynecological examination (general) (routine) without abnormal findings: Secondary | ICD-10-CM | POA: Diagnosis present

## 2014-05-09 LAB — CYTOLOGY - PAP

## 2014-05-14 ENCOUNTER — Other Ambulatory Visit: Payer: Self-pay | Admitting: Obstetrics and Gynecology

## 2014-06-18 ENCOUNTER — Other Ambulatory Visit: Payer: Self-pay | Admitting: Obstetrics and Gynecology

## 2014-06-18 DIAGNOSIS — N63 Unspecified lump in unspecified breast: Secondary | ICD-10-CM

## 2014-06-30 ENCOUNTER — Other Ambulatory Visit: Payer: 59

## 2014-07-01 ENCOUNTER — Ambulatory Visit
Admission: RE | Admit: 2014-07-01 | Discharge: 2014-07-01 | Disposition: A | Discharge status: Home / Self Care | Payer: 59 | Source: Ambulatory Visit | Attending: Obstetrics and Gynecology | Admitting: Obstetrics and Gynecology

## 2014-07-01 DIAGNOSIS — N63 Unspecified lump in unspecified breast: Secondary | ICD-10-CM

## 2014-08-11 ENCOUNTER — Ambulatory Visit
Admission: RE | Admit: 2014-08-11 | Discharge: 2014-08-11 | Disposition: A | Discharge status: Home / Self Care | Payer: 59 | Source: Ambulatory Visit | Attending: Cardiology | Admitting: Cardiology

## 2014-08-11 ENCOUNTER — Other Ambulatory Visit: Payer: Self-pay | Admitting: Cardiology

## 2014-08-11 DIAGNOSIS — R0602 Shortness of breath: Secondary | ICD-10-CM

## 2015-05-19 ENCOUNTER — Other Ambulatory Visit (HOSPITAL_COMMUNITY)
Admission: RE | Admit: 2015-05-19 | Discharge: 2015-05-19 | Disposition: A | Discharge status: Home / Self Care | Payer: 59 | Source: Ambulatory Visit | Attending: Obstetrics and Gynecology | Admitting: Obstetrics and Gynecology

## 2015-05-19 ENCOUNTER — Other Ambulatory Visit: Payer: Self-pay | Admitting: Obstetrics and Gynecology

## 2015-05-19 DIAGNOSIS — Z01419 Encounter for gynecological examination (general) (routine) without abnormal findings: Secondary | ICD-10-CM | POA: Diagnosis present

## 2015-05-20 LAB — CYTOLOGY - PAP

## 2015-07-20 ENCOUNTER — Other Ambulatory Visit: Payer: Self-pay

## 2015-07-20 DIAGNOSIS — Z1231 Encounter for screening mammogram for malignant neoplasm of breast: Secondary | ICD-10-CM

## 2015-08-11 ENCOUNTER — Ambulatory Visit
Admission: RE | Admit: 2015-08-11 | Discharge: 2015-08-11 | Disposition: A | Discharge status: Home / Self Care | Payer: Commercial Managed Care - HMO | Source: Ambulatory Visit

## 2015-08-11 DIAGNOSIS — Z1231 Encounter for screening mammogram for malignant neoplasm of breast: Secondary | ICD-10-CM

## 2016-05-24 ENCOUNTER — Other Ambulatory Visit: Payer: Self-pay | Admitting: Obstetrics and Gynecology

## 2016-05-24 ENCOUNTER — Other Ambulatory Visit (HOSPITAL_COMMUNITY)
Admission: RE | Admit: 2016-05-24 | Discharge: 2016-05-24 | Disposition: A | Discharge status: Home / Self Care | Payer: Commercial Managed Care - HMO | Source: Ambulatory Visit | Attending: Obstetrics and Gynecology | Admitting: Obstetrics and Gynecology

## 2016-05-24 DIAGNOSIS — Z1151 Encounter for screening for human papillomavirus (HPV): Secondary | ICD-10-CM | POA: Diagnosis present

## 2016-05-24 DIAGNOSIS — Z01419 Encounter for gynecological examination (general) (routine) without abnormal findings: Secondary | ICD-10-CM | POA: Diagnosis present

## 2016-05-26 LAB — CYTOLOGY - PAP
Diagnosis: NEGATIVE
HPV: NOT DETECTED

## 2016-07-25 DIAGNOSIS — L719 Rosacea, unspecified: Secondary | ICD-10-CM | POA: Diagnosis not present

## 2016-08-25 DIAGNOSIS — E041 Nontoxic single thyroid nodule: Secondary | ICD-10-CM | POA: Diagnosis not present

## 2016-08-25 DIAGNOSIS — E039 Hypothyroidism, unspecified: Secondary | ICD-10-CM | POA: Diagnosis not present

## 2016-09-23 DIAGNOSIS — E785 Hyperlipidemia, unspecified: Secondary | ICD-10-CM | POA: Diagnosis not present

## 2016-09-23 DIAGNOSIS — I10 Essential (primary) hypertension: Secondary | ICD-10-CM | POA: Diagnosis not present

## 2016-09-23 DIAGNOSIS — I493 Ventricular premature depolarization: Secondary | ICD-10-CM | POA: Diagnosis not present

## 2016-09-26 ENCOUNTER — Other Ambulatory Visit: Payer: Self-pay | Admitting: Obstetrics and Gynecology

## 2016-09-26 DIAGNOSIS — Z1231 Encounter for screening mammogram for malignant neoplasm of breast: Secondary | ICD-10-CM

## 2016-10-12 ENCOUNTER — Ambulatory Visit
Admission: RE | Admit: 2016-10-12 | Discharge: 2016-10-12 | Disposition: A | Discharge status: Home / Self Care | Payer: Commercial Managed Care - HMO | Source: Ambulatory Visit | Attending: Obstetrics and Gynecology | Admitting: Obstetrics and Gynecology

## 2016-10-12 DIAGNOSIS — Z1231 Encounter for screening mammogram for malignant neoplasm of breast: Secondary | ICD-10-CM

## 2016-10-19 DIAGNOSIS — D1801 Hemangioma of skin and subcutaneous tissue: Secondary | ICD-10-CM | POA: Diagnosis not present

## 2016-10-19 DIAGNOSIS — L719 Rosacea, unspecified: Secondary | ICD-10-CM | POA: Diagnosis not present

## 2016-10-19 DIAGNOSIS — D485 Neoplasm of uncertain behavior of skin: Secondary | ICD-10-CM | POA: Diagnosis not present

## 2016-10-19 DIAGNOSIS — D235 Other benign neoplasm of skin of trunk: Secondary | ICD-10-CM | POA: Diagnosis not present

## 2016-10-19 DIAGNOSIS — L814 Other melanin hyperpigmentation: Secondary | ICD-10-CM | POA: Diagnosis not present

## 2016-12-15 DIAGNOSIS — L719 Rosacea, unspecified: Secondary | ICD-10-CM | POA: Diagnosis not present

## 2016-12-15 DIAGNOSIS — D485 Neoplasm of uncertain behavior of skin: Secondary | ICD-10-CM | POA: Diagnosis not present

## 2016-12-15 DIAGNOSIS — L570 Actinic keratosis: Secondary | ICD-10-CM | POA: Diagnosis not present

## 2016-12-15 DIAGNOSIS — L821 Other seborrheic keratosis: Secondary | ICD-10-CM | POA: Diagnosis not present

## 2017-03-10 DIAGNOSIS — J301 Allergic rhinitis due to pollen: Secondary | ICD-10-CM | POA: Diagnosis not present

## 2017-03-10 DIAGNOSIS — J3089 Other allergic rhinitis: Secondary | ICD-10-CM | POA: Diagnosis not present

## 2017-03-10 DIAGNOSIS — R05 Cough: Secondary | ICD-10-CM | POA: Diagnosis not present

## 2017-04-27 DIAGNOSIS — M25561 Pain in right knee: Secondary | ICD-10-CM | POA: Diagnosis not present

## 2017-04-27 DIAGNOSIS — I10 Essential (primary) hypertension: Secondary | ICD-10-CM | POA: Diagnosis not present

## 2017-04-27 DIAGNOSIS — M25562 Pain in left knee: Secondary | ICD-10-CM | POA: Diagnosis not present

## 2017-07-31 DIAGNOSIS — Z01419 Encounter for gynecological examination (general) (routine) without abnormal findings: Secondary | ICD-10-CM | POA: Diagnosis not present

## 2017-08-29 DIAGNOSIS — E039 Hypothyroidism, unspecified: Secondary | ICD-10-CM | POA: Diagnosis not present

## 2017-08-29 DIAGNOSIS — E041 Nontoxic single thyroid nodule: Secondary | ICD-10-CM | POA: Diagnosis not present

## 2017-10-17 DIAGNOSIS — L821 Other seborrheic keratosis: Secondary | ICD-10-CM | POA: Diagnosis not present

## 2017-10-17 DIAGNOSIS — D235 Other benign neoplasm of skin of trunk: Secondary | ICD-10-CM | POA: Diagnosis not present

## 2017-10-17 DIAGNOSIS — D485 Neoplasm of uncertain behavior of skin: Secondary | ICD-10-CM | POA: Diagnosis not present

## 2017-10-17 DIAGNOSIS — D239 Other benign neoplasm of skin, unspecified: Secondary | ICD-10-CM | POA: Diagnosis not present

## 2017-10-17 DIAGNOSIS — D4989 Neoplasm of unspecified behavior of other specified sites: Secondary | ICD-10-CM | POA: Diagnosis not present

## 2017-10-17 DIAGNOSIS — Z86008 Personal history of in-situ neoplasm of other site: Secondary | ICD-10-CM | POA: Diagnosis not present

## 2017-10-17 DIAGNOSIS — L820 Inflamed seborrheic keratosis: Secondary | ICD-10-CM | POA: Diagnosis not present

## 2017-11-01 DIAGNOSIS — E039 Hypothyroidism, unspecified: Secondary | ICD-10-CM | POA: Diagnosis not present

## 2017-11-01 DIAGNOSIS — J309 Allergic rhinitis, unspecified: Secondary | ICD-10-CM | POA: Diagnosis not present

## 2017-11-01 DIAGNOSIS — M2550 Pain in unspecified joint: Secondary | ICD-10-CM | POA: Diagnosis not present

## 2017-11-01 DIAGNOSIS — E041 Nontoxic single thyroid nodule: Secondary | ICD-10-CM | POA: Diagnosis not present

## 2017-11-02 DIAGNOSIS — D487 Neoplasm of uncertain behavior of other specified sites: Secondary | ICD-10-CM | POA: Diagnosis not present

## 2017-11-02 DIAGNOSIS — D229 Melanocytic nevi, unspecified: Secondary | ICD-10-CM | POA: Diagnosis not present

## 2017-11-02 DIAGNOSIS — D2262 Melanocytic nevi of left upper limb, including shoulder: Secondary | ICD-10-CM | POA: Diagnosis not present

## 2017-11-21 DIAGNOSIS — H43821 Vitreomacular adhesion, right eye: Secondary | ICD-10-CM | POA: Diagnosis not present

## 2017-11-21 DIAGNOSIS — H35372 Puckering of macula, left eye: Secondary | ICD-10-CM | POA: Diagnosis not present

## 2017-11-21 DIAGNOSIS — H43812 Vitreous degeneration, left eye: Secondary | ICD-10-CM | POA: Diagnosis not present

## 2017-12-08 DIAGNOSIS — M2550 Pain in unspecified joint: Secondary | ICD-10-CM | POA: Diagnosis not present

## 2017-12-08 DIAGNOSIS — M150 Primary generalized (osteo)arthritis: Secondary | ICD-10-CM | POA: Diagnosis not present

## 2017-12-08 DIAGNOSIS — R768 Other specified abnormal immunological findings in serum: Secondary | ICD-10-CM | POA: Diagnosis not present

## 2017-12-20 DIAGNOSIS — J0100 Acute maxillary sinusitis, unspecified: Secondary | ICD-10-CM | POA: Diagnosis not present

## 2018-03-07 DIAGNOSIS — J3089 Other allergic rhinitis: Secondary | ICD-10-CM | POA: Diagnosis not present

## 2018-03-07 DIAGNOSIS — R05 Cough: Secondary | ICD-10-CM | POA: Diagnosis not present

## 2018-03-07 DIAGNOSIS — J301 Allergic rhinitis due to pollen: Secondary | ICD-10-CM | POA: Diagnosis not present

## 2019-11-12 ENCOUNTER — Other Ambulatory Visit: Payer: Self-pay | Admitting: Family Medicine

## 2020-09-21 ENCOUNTER — Other Ambulatory Visit: Payer: Self-pay | Admitting: Obstetrics and Gynecology

## 2020-09-21 DIAGNOSIS — Z1231 Encounter for screening mammogram for malignant neoplasm of breast: Secondary | ICD-10-CM

## 2020-11-20 ENCOUNTER — Other Ambulatory Visit: Payer: Self-pay

## 2020-11-20 ENCOUNTER — Ambulatory Visit
Admission: RE | Admit: 2020-11-20 | Discharge: 2020-11-20 | Disposition: A | Discharge status: Home / Self Care | Payer: 59 | Source: Ambulatory Visit | Attending: Obstetrics and Gynecology | Admitting: Obstetrics and Gynecology

## 2020-11-20 DIAGNOSIS — Z1231 Encounter for screening mammogram for malignant neoplasm of breast: Secondary | ICD-10-CM

## 2022-06-25 IMAGING — MG MM DIGITAL SCREENING BILAT W/ TOMO AND CAD
6 of 10 series · 6 of 30 positions shown · non-contrast
Comparison: Previous exam(s).

CLINICAL DATA: Screening.

EXAM:
DIGITAL SCREENING BILATERAL MAMMOGRAM WITH TOMOSYNTHESIS AND CAD
TECHNIQUE: Bilateral screening digital craniocaudal and mediolateral oblique
mammograms were obtained. Bilateral screening digital breast
tomosynthesis was performed. The images were evaluated with
computer-aided detection.

[R CC synth-2D]
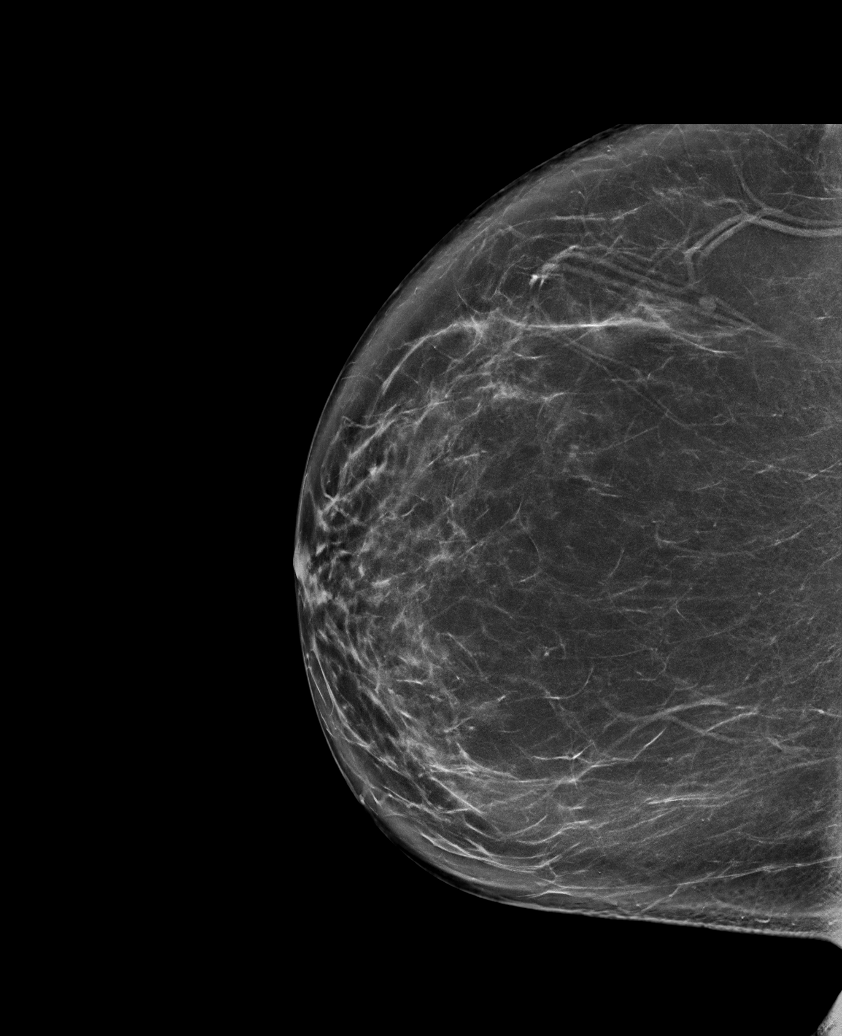

[L CC synth-2D (1 of 2)]
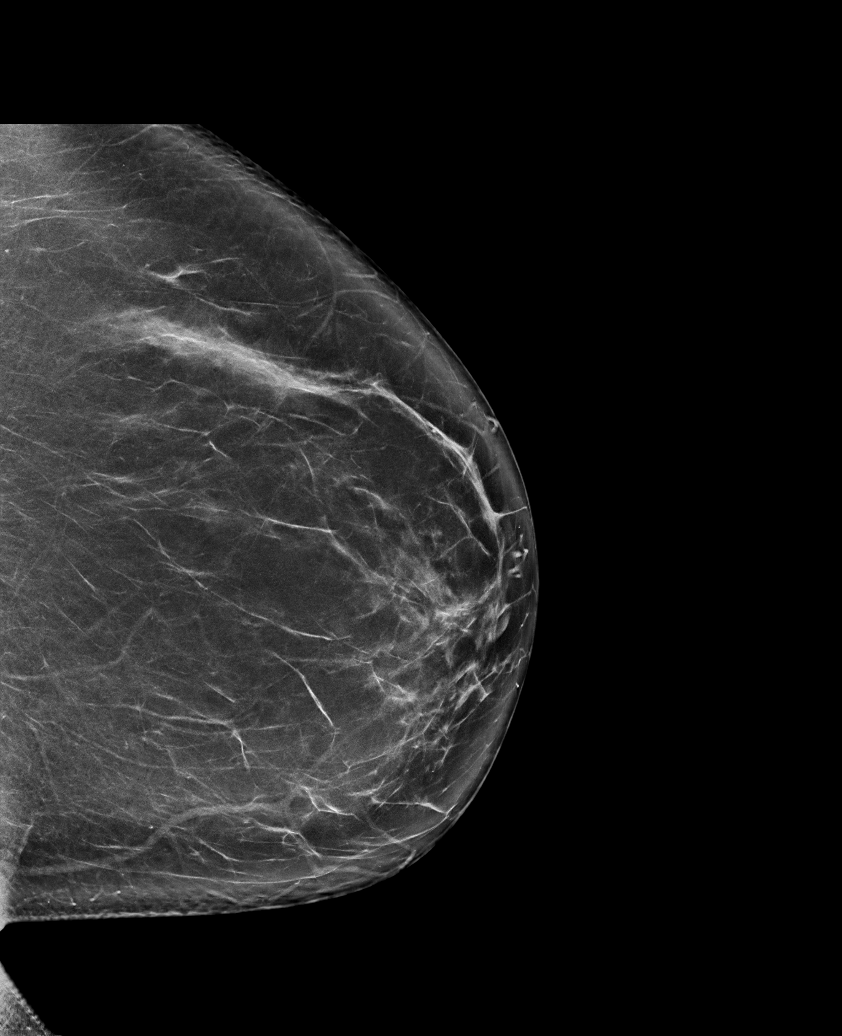

[R MLO synth-2D]
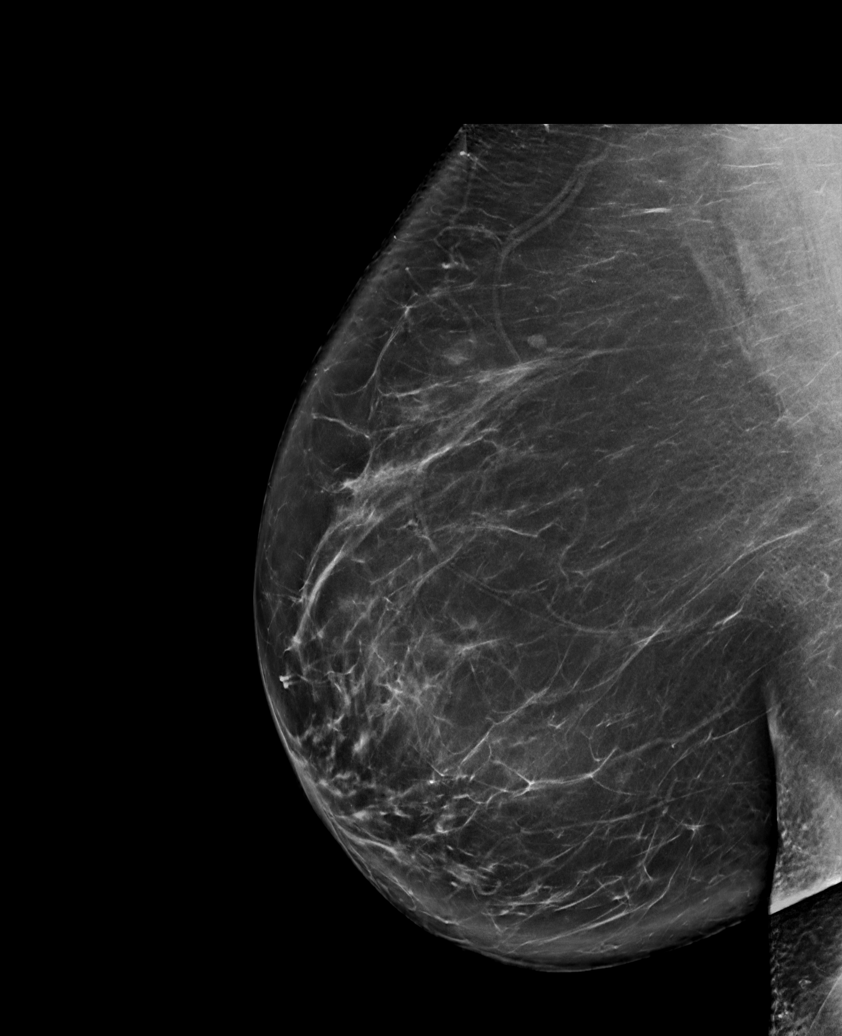

[L MLO synth-2D]
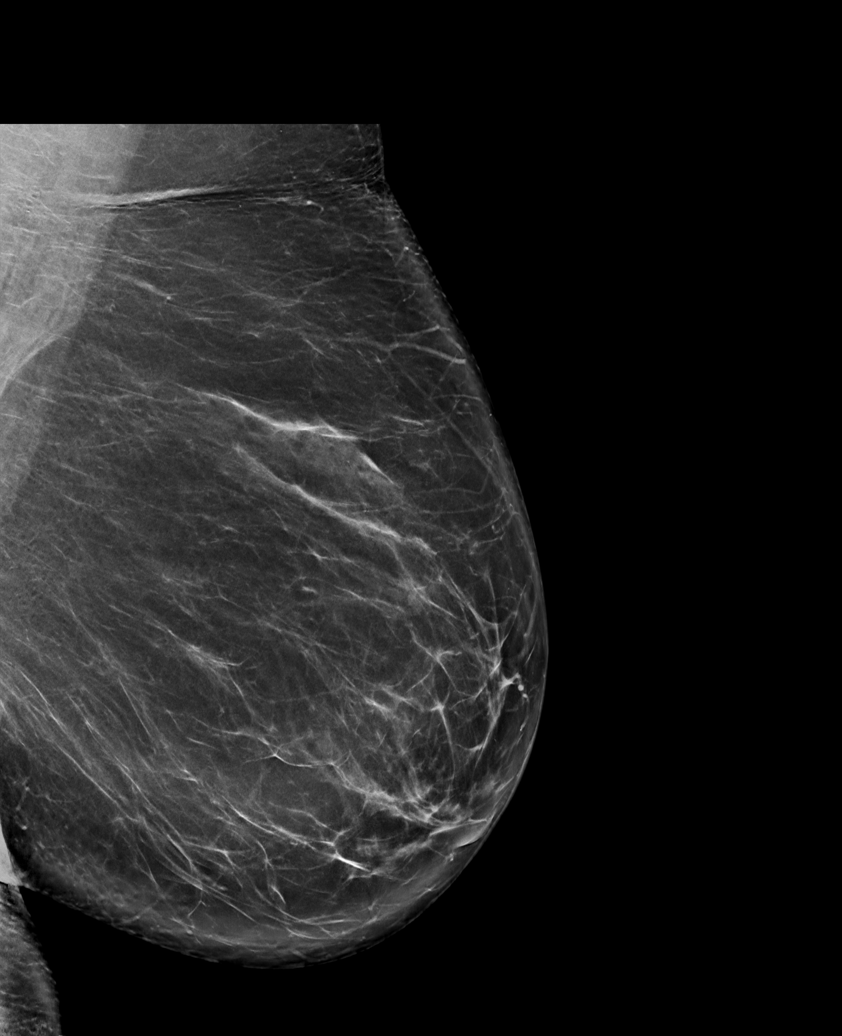

[L CC synth-2D (2 of 2)]
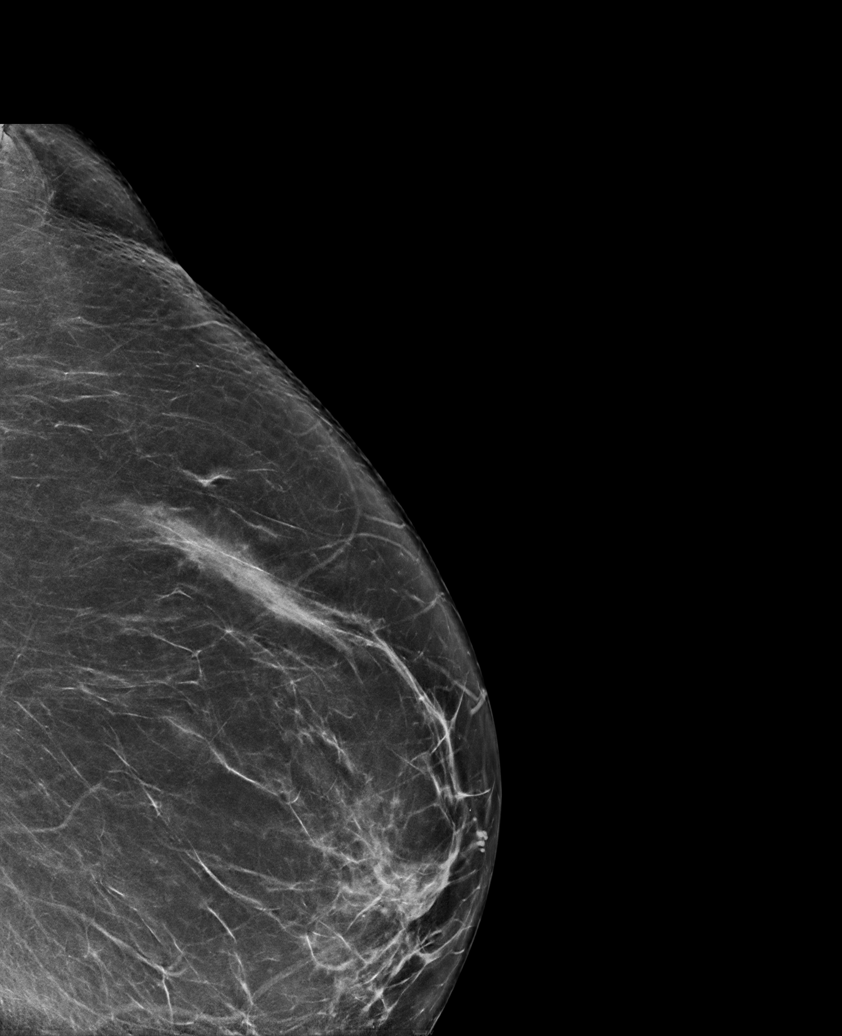

[R MLO tomo · tomo slice 47/94.0]
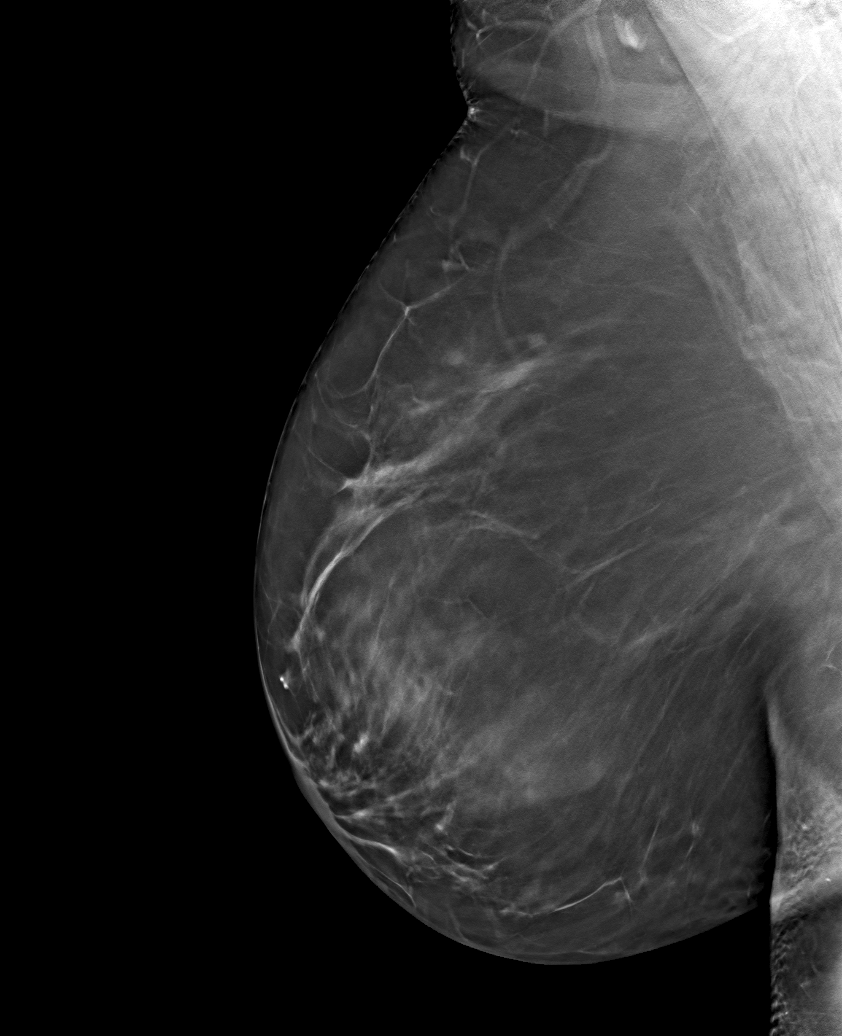

[6 of 30 positions shown; findings below may reference images not displayed]

ACR Breast Density Category b: There are scattered areas of
fibroglandular density.
FINDINGS: There are no findings suspicious for malignancy. The images were
evaluated with computer-aided detection.
IMPRESSION: No mammographic evidence of malignancy. A result letter of this
screening mammogram will be mailed directly to the patient.

RECOMMENDATION:
Screening mammogram in one year. (Code:WJ-I-BG6)

BI-RADS CATEGORY  1: Negative.

## 2022-09-26 ENCOUNTER — Other Ambulatory Visit (HOSPITAL_COMMUNITY)
Admission: RE | Admit: 2022-09-26 | Discharge: 2022-09-26 | Disposition: A | Discharge status: Home / Self Care | Payer: 59 | Source: Ambulatory Visit | Attending: Obstetrics and Gynecology | Admitting: Obstetrics and Gynecology

## 2022-09-26 ENCOUNTER — Other Ambulatory Visit: Payer: Self-pay | Admitting: Obstetrics and Gynecology

## 2022-09-26 DIAGNOSIS — Z01419 Encounter for gynecological examination (general) (routine) without abnormal findings: Secondary | ICD-10-CM | POA: Insufficient documentation

## 2022-09-26 DIAGNOSIS — Z1231 Encounter for screening mammogram for malignant neoplasm of breast: Secondary | ICD-10-CM

## 2022-09-28 LAB — CYTOLOGY - PAP
Comment: NEGATIVE
Diagnosis: NEGATIVE
High risk HPV: NEGATIVE

## 2022-11-09 ENCOUNTER — Ambulatory Visit
Admission: RE | Admit: 2022-11-09 | Discharge: 2022-11-09 | Disposition: A | Discharge status: Home / Self Care | Payer: 59 | Source: Ambulatory Visit | Attending: Obstetrics and Gynecology | Admitting: Obstetrics and Gynecology

## 2022-11-09 DIAGNOSIS — Z1231 Encounter for screening mammogram for malignant neoplasm of breast: Secondary | ICD-10-CM

## 2023-10-19 ENCOUNTER — Other Ambulatory Visit: Payer: Self-pay | Admitting: Obstetrics and Gynecology

## 2023-10-19 DIAGNOSIS — Z1231 Encounter for screening mammogram for malignant neoplasm of breast: Secondary | ICD-10-CM

## 2023-11-15 ENCOUNTER — Ambulatory Visit
Admission: RE | Admit: 2023-11-15 | Discharge: 2023-11-15 | Disposition: A | Discharge status: Home / Self Care | Source: Ambulatory Visit | Attending: Obstetrics and Gynecology | Admitting: Obstetrics and Gynecology

## 2023-11-15 DIAGNOSIS — Z1231 Encounter for screening mammogram for malignant neoplasm of breast: Secondary | ICD-10-CM
